# Patient Record
Sex: Male | Born: 1995 | Hispanic: Yes | Marital: Married | Smoking: Never smoker
Health system: Southern US, Community
[De-identification: ages and names within clinical notes are randomized; demographics above are authoritative.]

---

## 2014-01-06 ENCOUNTER — Ambulatory Visit
Admission: RE | Admit: 2014-01-06 | Discharge: 2014-01-06 | Disposition: A | Discharge status: Home / Self Care | Payer: No Typology Code available for payment source | Source: Ambulatory Visit | Attending: Occupational Medicine | Admitting: Occupational Medicine

## 2014-01-06 ENCOUNTER — Other Ambulatory Visit: Payer: Self-pay | Admitting: Occupational Medicine

## 2014-01-06 DIAGNOSIS — Z021 Encounter for pre-employment examination: Secondary | ICD-10-CM

## 2016-05-08 IMAGING — CR DG CHEST 1V
1 series · 1 of 1 positions shown · non-contrast
Comparison: None.

CLINICAL DATA: Pre-employment examination

EXAM:
CHEST - 1 VIEW

[view not recorded]
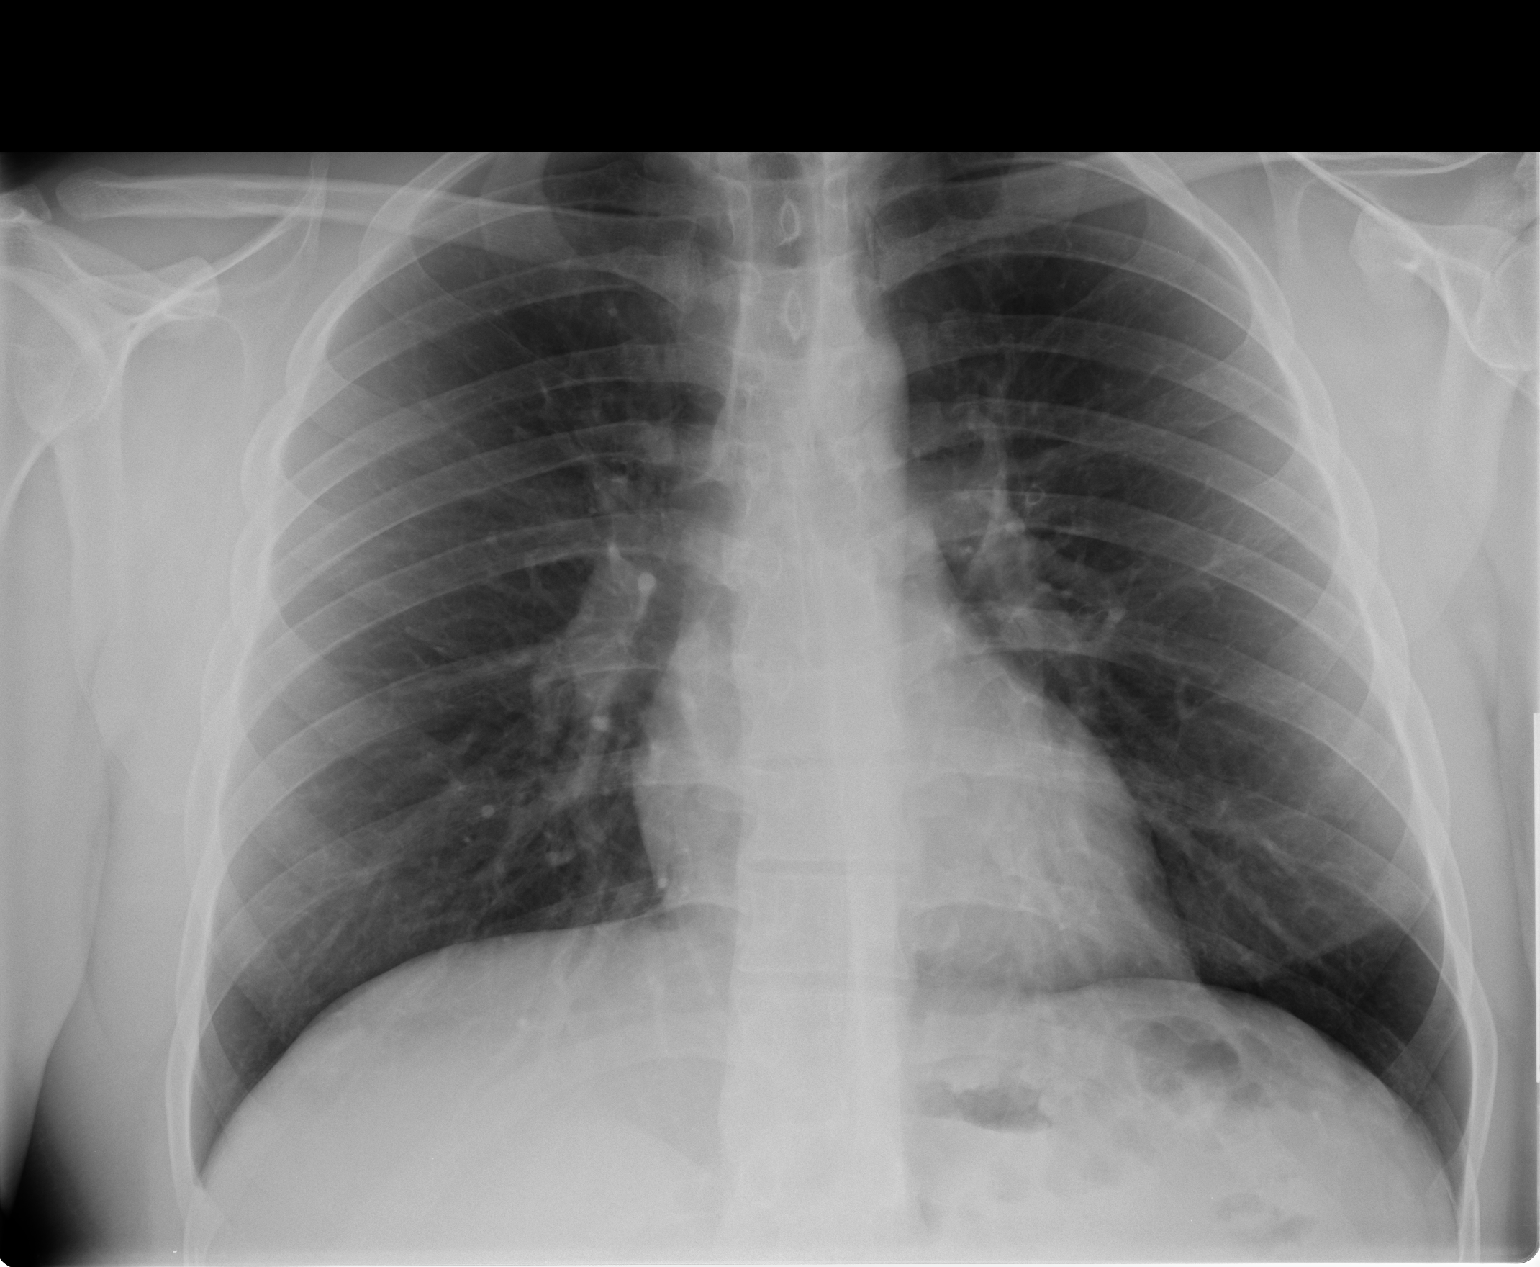

[1 of 1 positions shown; findings below may reference images not displayed]

FINDINGS: Cardiomediastinal silhouette is unremarkable. No acute infiltrate or
pleural effusion. No pulmonary edema. Bony thorax is unremarkable.
IMPRESSION: No active disease.

## 2016-12-23 DIAGNOSIS — H1089 Other conjunctivitis: Secondary | ICD-10-CM | POA: Diagnosis not present

## 2016-12-23 DIAGNOSIS — K1379 Other lesions of oral mucosa: Secondary | ICD-10-CM | POA: Diagnosis not present

## 2016-12-27 DIAGNOSIS — R1310 Dysphagia, unspecified: Secondary | ICD-10-CM | POA: Diagnosis not present

## 2016-12-29 DIAGNOSIS — J029 Acute pharyngitis, unspecified: Secondary | ICD-10-CM | POA: Diagnosis not present

## 2016-12-29 DIAGNOSIS — K219 Gastro-esophageal reflux disease without esophagitis: Secondary | ICD-10-CM | POA: Diagnosis not present

## 2016-12-29 DIAGNOSIS — J039 Acute tonsillitis, unspecified: Secondary | ICD-10-CM | POA: Diagnosis not present

## 2017-02-13 DIAGNOSIS — R030 Elevated blood-pressure reading, without diagnosis of hypertension: Secondary | ICD-10-CM | POA: Diagnosis not present

## 2017-02-24 DIAGNOSIS — Z7689 Persons encountering health services in other specified circumstances: Secondary | ICD-10-CM | POA: Diagnosis not present

## 2017-02-26 DIAGNOSIS — Z0001 Encounter for general adult medical examination with abnormal findings: Secondary | ICD-10-CM | POA: Diagnosis not present

## 2017-02-26 DIAGNOSIS — R030 Elevated blood-pressure reading, without diagnosis of hypertension: Secondary | ICD-10-CM | POA: Diagnosis not present

## 2017-02-26 DIAGNOSIS — K625 Hemorrhage of anus and rectum: Secondary | ICD-10-CM | POA: Diagnosis not present

## 2017-03-01 ENCOUNTER — Ambulatory Visit (INDEPENDENT_AMBULATORY_CARE_PROVIDER_SITE_OTHER): Payer: 59 | Admitting: Otolaryngology

## 2017-03-01 DIAGNOSIS — J351 Hypertrophy of tonsils: Secondary | ICD-10-CM

## 2017-11-25 ENCOUNTER — Other Ambulatory Visit: Payer: Self-pay

## 2017-11-25 ENCOUNTER — Encounter (HOSPITAL_COMMUNITY): Payer: Self-pay

## 2017-11-25 ENCOUNTER — Emergency Department (HOSPITAL_COMMUNITY)
Admission: EM | Admit: 2017-11-25 | Discharge: 2017-11-25 | Disposition: A | Discharge status: Home / Self Care | Payer: No Typology Code available for payment source | Attending: Emergency Medicine | Admitting: Emergency Medicine

## 2017-11-25 DIAGNOSIS — Z8659 Personal history of other mental and behavioral disorders: Secondary | ICD-10-CM | POA: Diagnosis not present
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
## 2017-11-25 NOTE — Discharge Instructions (Signed)
Return to ED for worsening symptoms, chest pain, shortness of breath, severe headache, vomiting or coughing up blood, lightheadedness or loss of consciousness. °
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
## 2017-11-25 NOTE — ED Notes (Signed)
Bed: WA02 Expected date:  Expected time:  Means of arrival:  Comments: 

## 2017-12-13 DIAGNOSIS — S40022A Contusion of left upper arm, initial encounter: Secondary | ICD-10-CM | POA: Diagnosis not present

## 2017-12-13 DIAGNOSIS — M79602 Pain in left arm: Secondary | ICD-10-CM | POA: Diagnosis not present

## 2018-01-01 DIAGNOSIS — S40022D Contusion of left upper arm, subsequent encounter: Secondary | ICD-10-CM | POA: Diagnosis not present

## 2018-01-01 DIAGNOSIS — M25522 Pain in left elbow: Secondary | ICD-10-CM | POA: Diagnosis not present

## 2018-01-15 DIAGNOSIS — S40022D Contusion of left upper arm, subsequent encounter: Secondary | ICD-10-CM | POA: Diagnosis not present

## 2018-01-15 DIAGNOSIS — M25522 Pain in left elbow: Secondary | ICD-10-CM | POA: Diagnosis not present

## 2018-02-06 DIAGNOSIS — M542 Cervicalgia: Secondary | ICD-10-CM | POA: Diagnosis not present

## 2018-02-08 DIAGNOSIS — M542 Cervicalgia: Secondary | ICD-10-CM | POA: Diagnosis not present

## 2018-02-15 DIAGNOSIS — M542 Cervicalgia: Secondary | ICD-10-CM | POA: Diagnosis not present

## 2018-02-20 DIAGNOSIS — M25522 Pain in left elbow: Secondary | ICD-10-CM | POA: Diagnosis not present

## 2018-02-20 DIAGNOSIS — M542 Cervicalgia: Secondary | ICD-10-CM | POA: Diagnosis not present

## 2018-02-20 DIAGNOSIS — S40022D Contusion of left upper arm, subsequent encounter: Secondary | ICD-10-CM | POA: Diagnosis not present

## 2018-02-26 DIAGNOSIS — Z6834 Body mass index (BMI) 34.0-34.9, adult: Secondary | ICD-10-CM | POA: Diagnosis not present

## 2018-02-26 DIAGNOSIS — R030 Elevated blood-pressure reading, without diagnosis of hypertension: Secondary | ICD-10-CM | POA: Diagnosis not present

## 2018-03-08 DIAGNOSIS — Z0000 Encounter for general adult medical examination without abnormal findings: Secondary | ICD-10-CM | POA: Diagnosis not present

## 2018-03-08 DIAGNOSIS — R7301 Impaired fasting glucose: Secondary | ICD-10-CM | POA: Diagnosis not present

## 2018-03-08 DIAGNOSIS — R030 Elevated blood-pressure reading, without diagnosis of hypertension: Secondary | ICD-10-CM | POA: Diagnosis not present

## 2018-04-05 DIAGNOSIS — M542 Cervicalgia: Secondary | ICD-10-CM | POA: Diagnosis not present

## 2018-04-05 DIAGNOSIS — R030 Elevated blood-pressure reading, without diagnosis of hypertension: Secondary | ICD-10-CM | POA: Diagnosis not present

## 2018-10-04 ENCOUNTER — Other Ambulatory Visit: Payer: Self-pay

## 2018-10-04 DIAGNOSIS — Z20822 Contact with and (suspected) exposure to covid-19: Secondary | ICD-10-CM

## 2018-10-06 LAB — NOVEL CORONAVIRUS, NAA: SARS-CoV-2, NAA: NOT DETECTED

## 2020-04-26 ENCOUNTER — Other Ambulatory Visit: Payer: Self-pay

## 2020-04-26 ENCOUNTER — Ambulatory Visit (INDEPENDENT_AMBULATORY_CARE_PROVIDER_SITE_OTHER): Payer: 59 | Admitting: Pulmonary Disease

## 2020-04-26 ENCOUNTER — Encounter: Payer: Self-pay | Admitting: Pulmonary Disease

## 2020-04-26 VITALS — BP 124/80 | HR 104 | Temp 98.0°F | Ht 69.0 in | Wt 241.0 lb

## 2020-04-26 DIAGNOSIS — G4733 Obstructive sleep apnea (adult) (pediatric): Secondary | ICD-10-CM | POA: Diagnosis not present

## 2020-04-26 DIAGNOSIS — R0683 Snoring: Secondary | ICD-10-CM

## 2020-04-26 NOTE — Patient Instructions (Signed)
Home sleep test 
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
## 2020-04-26 NOTE — Assessment & Plan Note (Signed)
Given excessive daytime somnolence, narrow pharyngeal exam, witnessed apneas & loud snoring, obstructive sleep apnea is very likely & an overnight polysomnogram will be scheduled as a home study. The pathophysiology of obstructive sleep apnea , it's cardiovascular consequences & modes of treatment including CPAP were discused with the patient in detail & they evidenced understanding.  Pretest probability is high.  If he only has mild disease then we could consider weight loss and tonsillectomy alone.  However if he has more than AHI 15/hour, then we would have to offer him treatment in the form of oral appliance or CPAP.  Nasal interface may suffice.  He is willing to use if needed.

## 2020-05-11 ENCOUNTER — Other Ambulatory Visit: Payer: Self-pay

## 2020-05-11 ENCOUNTER — Ambulatory Visit: Payer: 59

## 2020-05-11 DIAGNOSIS — R0683 Snoring: Secondary | ICD-10-CM

## 2020-05-11 DIAGNOSIS — G4733 Obstructive sleep apnea (adult) (pediatric): Secondary | ICD-10-CM | POA: Diagnosis not present

## 2020-05-13 ENCOUNTER — Telehealth: Payer: Self-pay | Admitting: Pulmonary Disease

## 2020-05-13 DIAGNOSIS — G4733 Obstructive sleep apnea (adult) (pediatric): Secondary | ICD-10-CM

## 2020-05-13 NOTE — Telephone Encounter (Signed)
HST showed severe  OSA with AHI 29/ hr Suggest autoCPAP  5-15 cm, mask of choice OV with me/APP in 6 wks after starting

## 2020-05-18 ENCOUNTER — Telehealth: Payer: Self-pay | Admitting: Pulmonary Disease

## 2020-05-18 DIAGNOSIS — G4733 Obstructive sleep apnea (adult) (pediatric): Secondary | ICD-10-CM

## 2020-05-18 NOTE — Telephone Encounter (Signed)
Attempted to call patient with no answer and was unable to leave message on voicemail due to mailbox currently being full. Will try to call again at another time.

## 2020-05-18 NOTE — Telephone Encounter (Signed)
HST showed severe  OSA with AHI 29/ hr Suggest autoCPAP  5-15 cm, mask of choice OV with me/APP in 6 wks after starting   Called and spoke with pt and he is aware of results per RA.  Order placed for pt to be set up for cpap.  Pt is aware that there may be a wait on the cpap due to being on back order.  Pt is aware to call the office once he does receive the cpap so we can set him up with APPT with RA 6 wks after starting the cpap.

## 2021-08-12 ENCOUNTER — Telehealth: Payer: Self-pay | Admitting: Pulmonary Disease

## 2021-08-12 NOTE — Telephone Encounter (Signed)
ATC patient for clarification that insurance actually covers a new cpap every year? No answer.   Dr. Vassie Loll, please advise is it okay to send an order for a new cpap for patient? He was last seen in 04/26/2020

## 2021-08-15 NOTE — Telephone Encounter (Signed)
Next available for Dr. Vassie Loll in [place] and [facility] was 09/2021.  Called patient and he asked for a sooner appt. Scheduled him to see NP Ames Dura this Friday at 4:30 pm to reassess cpap machine. Dr. Vassie Loll pleae advise, is this ok?

## 2021-08-19 ENCOUNTER — Encounter: Payer: Self-pay | Admitting: Primary Care

## 2021-08-19 ENCOUNTER — Ambulatory Visit (INDEPENDENT_AMBULATORY_CARE_PROVIDER_SITE_OTHER): Payer: 59 | Admitting: Primary Care

## 2021-08-19 VITALS — BP 139/74 | HR 102 | Temp 98.3°F | Ht 69.0 in | Wt 255.0 lb

## 2021-08-19 DIAGNOSIS — G4733 Obstructive sleep apnea (adult) (pediatric): Secondary | ICD-10-CM
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
## 2021-08-19 NOTE — Patient Instructions (Signed)
Pleasure meeting you today Andrew Mcdaniel  Recommendations: Continue to wear CPAP every night min 4-6 hours or longer Work on weight loss if able Do not drive if experiencing excessive daytime sleepiness   Orders: Renew CPAP with [facility]   Follow-up: 1 year with Dr. Vassie Loll or sooner if needed   Sleep Apnea Sleep apnea affects breathing during sleep. It causes breathing to stop for 10 seconds or more, or to become shallow. People with sleep apnea usually snore loudly. It can also increase the risk of: Heart attack. Stroke. Being very overweight (obese). Diabetes. Heart failure. Irregular heartbeat. High blood pressure. The goal of treatment is to help you breathe normally again. What are the causes?  The most common cause of this condition is a collapsed or blocked airway. There are three kinds of sleep apnea: Obstructive sleep apnea. This is caused by a blocked or collapsed airway. Central sleep apnea. This happens when the brain does not send the right signals to the muscles that control breathing. Mixed sleep apnea. This is a combination of obstructive and central sleep apnea. What increases the risk? Being overweight. Smoking. Having a small airway. Being older. Being male. Drinking alcohol. Taking medicines to calm yourself (sedatives or tranquilizers). Having family members with the condition. Having a tongue or tonsils that are larger than normal. What are the signs or symptoms? Trouble staying asleep. Loud snoring. Headaches in the morning. Waking up gasping. Dry mouth or sore throat in the morning. Being sleepy or tired during the day. If you are sleepy or tired during the day, you may also: Not be able to focus your mind (concentrate). Forget things. Get angry a lot and have mood swings. Feel sad (depressed). Have changes in your personality. Have less interest in sex, if you are male. Be unable to have an erection, if you are male. How is  this treated?  Sleeping on your side. Using a medicine to get rid of mucus in your nose (decongestant). Avoiding the use of alcohol, medicines to help you relax, or certain pain medicines (narcotics). Losing weight, if needed. Changing your diet. Quitting smoking. Using a machine to open your airway while you sleep, such as: An oral appliance. This is a mouthpiece that shifts your lower jaw forward. A CPAP device. This device blows air through a mask when you breathe out (exhale). An EPAP device. This has valves that you put in each nostril. A BIPAP device. This device blows air through a mask when you breathe in (inhale) and breathe out. Having surgery if other treatments do not work. Follow these instructions at home: Lifestyle Make changes that your doctor recommends. Eat a healthy diet. Lose weight if needed. Avoid alcohol, medicines to help you relax, and some pain medicines. Do not smoke or use any products that contain nicotine or tobacco. If you need help quitting, ask your doctor. General instructions Take over-the-counter and prescription medicines only as told by your doctor. If you were given a machine to use while you sleep, use it only as told by your doctor. If you are having surgery, make sure to tell your doctor you have sleep apnea. You may need to bring your device with you. Keep all follow-up visits. Contact a doctor if: The machine that you were given to use during sleep bothers you or does not seem to be working. You do not get better. You get worse. Get help right away if: Your chest hurts. You have trouble breathing in enough air. You  have an uncomfortable feeling in your back, arms, or stomach. You have trouble talking. One side of your body feels weak. A part of your face is hanging down. These symptoms may be an emergency. Get help right away. Call your local emergency services (911 in the [place] Do not wait to see if the symptoms will go away. Do not  drive yourself to the hospital. Summary This condition affects breathing during sleep. The most common cause is a collapsed or blocked airway. The goal of treatment is to help you breathe normally while you sleep. This information is not intended to replace advice given to you by your health care provider. Make sure you discuss any questions you have with your health care provider. Document Revised: 08/25/2020 Document Reviewed: 12/26/2019 Elsevier Patient Education  2023 Elsevier Inc.   CPAP and BIPAP Information CPAP and BIPAP are methods that use air pressure to keep your airways open and to help you breathe well. CPAP and BIPAP use different amounts of pressure. Your health care provider will tell you whether CPAP or BIPAP would be more helpful for you. CPAP stands for "continuous positive airway pressure." With CPAP, the amount of pressure stays the same while you breathe in (inhale) and out (exhale). BIPAP stands for "bi-level positive airway pressure." With BIPAP, the amount of pressure will be higher when you inhale and lower when you exhale. This allows you to take larger breaths. CPAP or BIPAP may be used in the hospital, or your health care provider may want you to use it at home. You may need to have a sleep study before your health care provider can order a machine for you to use at home. What are the advantages? CPAP or BIPAP can be helpful if you have: Sleep apnea. Chronic obstructive pulmonary disease (COPD). Heart failure. Medical conditions that cause muscle weakness, including muscular dystrophy or amyotrophic lateral sclerosis (ALS). Other problems that cause breathing to be shallow, weak, abnormal, or difficult. CPAP and BIPAP are most commonly used for obstructive sleep apnea (OSA) to keep the airways from collapsing when the muscles relax during sleep. What are the risks? Generally, this is a safe treatment. However, problems may occur, including: Irritated skin or  skin sores if the mask does not fit properly. Dry or stuffy nose or nosebleeds. Dry mouth. Feeling gassy or bloated. Sinus or lung infection if the equipment is not cleaned properly. When should CPAP or BIPAP be used? In most cases, the mask only needs to be worn during sleep. Generally, the mask needs to be worn throughout the night and during any daytime naps. People with certain medical conditions may also need to wear the mask at other times, such as when they are awake. Follow instructions from your health care provider about when to use the machine. What happens during CPAP or BIPAP?  Both CPAP and BIPAP are provided by a small machine with a flexible plastic tube that attaches to a plastic mask that you wear. Air is blown through the mask into your nose or mouth. The amount of pressure that is used to blow the air can be adjusted on the machine. Your health care provider will set the pressure setting and help you find the best mask for you. Tips for using the mask Because the mask needs to be snug, some people feel trapped or closed-in (claustrophobic) when first using the mask. If you feel this way, you may need to get used to the mask. One way to do  this is to hold the mask loosely over your nose or mouth and then gradually apply the mask more snugly. You can also gradually increase the amount of time that you use the mask. Masks are available in various types and sizes. If your mask does not fit well, talk with your health care provider about getting a different one. Some common types of masks include: Full face masks, which fit over the mouth and nose. Nasal masks, which fit over the nose. Nasal pillow or prong masks, which fit into the nostrils. If you are using a mask that fits over your nose and you tend to breathe through your mouth, a chin strap may be applied to help keep your mouth closed. Use a skin barrier to protect your skin as told by your health care provider. Some CPAP and  BIPAP machines have alarms that may sound if the mask comes off or develops a leak. If you have trouble with the mask, it is very important that you talk with your health care provider about finding a way to make the mask easier to tolerate. Do not stop using the mask. There could be a negative impact on your health if you stop using the mask. Tips for using the machine Place your CPAP or BIPAP machine on a secure table or stand near an electrical outlet. Know where the on/off switch is on the machine. Follow instructions from your health care provider about how to set the pressure on your machine and when you should use it. Do not eat or drink while the CPAP or BIPAP machine is on. Food or fluids could get pushed into your lungs by the pressure of the CPAP or BIPAP. For home use, CPAP and BIPAP machines can be rented or purchased through home health care companies. Many different brands of machines are available. Renting a machine before purchasing may help you find out which particular machine works well for you. Your health insurance company may also decide which machine you may get. Keep the CPAP or BIPAP machine and attachments clean. Ask your health care provider for specific instructions. Check the humidifier if you have a dry stuffy nose or nosebleeds. Make sure it is working correctly. Follow these instructions at home: Take over-the-counter and prescription medicines only as told by your health care provider. Ask if you can take sinus medicine if your sinuses are blocked. Do not use any products that contain nicotine or tobacco. These products include cigarettes, chewing tobacco, and vaping devices, such as e-cigarettes. If you need help quitting, ask your health care provider. Keep all follow-up visits. This is important. Contact a health care provider if: You have redness or pressure sores on your head, face, mouth, or nose from the mask or head gear. You have trouble using the CPAP or  BIPAP machine. You cannot tolerate wearing the CPAP or BIPAP mask. Someone tells you that you snore even when wearing your CPAP or BIPAP. Get help right away if: You have trouble breathing. You feel confused. Summary CPAP and BIPAP are methods that use air pressure to keep your airways open and to help you breathe well. If you have trouble with the mask, it is very important that you talk with your health care provider about finding a way to make the mask easier to tolerate. Do not stop using the mask. There could be a negative impact to your health if you stop using the mask. Follow instructions from your health care provider about when to  use the machine. This information is not intended to replace advice given to you by your health care provider. Make sure you discuss any questions you have with your health care provider. Document Revised: 08/25/2020 Document Reviewed: 12/26/2019 Elsevier Patient Education  2023 [employer].

## 2021-08-19 NOTE — Assessment & Plan Note (Signed)
-   HST 05/11/21 >>moderate-severe OSA, AHI 29/hr. Patient is compliant with CPAP and reports benefit from CPAP use. His sleep/work schedule varies. Pressure 5-15cm h20 (12.1cm h20-95%); AHI 1.7/hr. Renewing CPAP supplies with [facility]. Advised patient aim to wear CPAP every night for min 4-6 hours. Encourage weight loss efforts. Advised against driving if experiencing excessive daytime sleepiness. FU in 1 year or sooner if needed.

## 2022-12-25 ENCOUNTER — Telehealth: Payer: Self-pay | Admitting: Primary Care

## 2022-12-25 NOTE — Telephone Encounter (Signed)
Needs a new machine and CPAP supplies

## 2022-12-27 NOTE — Telephone Encounter (Signed)
Patient not seen in >1year. Please schedule follow up. Cannot order new machine/supplies without visit.

## 2023-01-30 ENCOUNTER — Encounter: Payer: Self-pay | Admitting: Primary Care

## 2023-01-30 ENCOUNTER — Ambulatory Visit: Payer: 59 | Admitting: Primary Care

## 2023-01-30 VITALS — BP 123/87 | HR 91 | Temp 97.3°F | Ht 69.0 in | Wt 282.0 lb

## 2023-01-30 DIAGNOSIS — G4733 Obstructive sleep apnea (adult) (pediatric): Secondary | ICD-10-CM | POA: Diagnosis not present

## 2023-01-30 NOTE — Patient Instructions (Addendum)
 -  OBSTRUCTIVE SLEEP APNEA: Obstructive sleep apnea is a condition where your breathing repeatedly stops and starts during sleep due to blocked airways. Your condition is currently well controlled with the auto CPAP machine, which you have been using consistently. We will continue with your current settings (minimum pressure of 5 and maximum pressure of 15). You should renew your CPAP supplies through [facility]. To address the drooling issue, you can add a chin strap to help keep your mouth closed during sleep. If the drooling continues, consider adjusting the humidification settings on your machine. Additionally, we will provide a prescription for a travel CPAP machine, which you can purchase out-of-pocket.  Orders: Renew CPAP supplies/add chin strap    Follow-up 1 year with Beth NP or Dr. Jude

## 2023-01-30 NOTE — Progress Notes (Signed)
 @Patient  ID: Andrew Mcdaniel, male    DOB: 07-23-1995, 27 y.o.   MRN: [id number]  No chief complaint on file.   Referring provider: Suanne Pfeiffer, NP  HPI: 27 year old male, never smoked.  Past medical history significant for OSA.  Patient of Dr. Jude, seen for initial consult on 04/26/20.   Previous [facility] pulmonary encounter:  08/19/2021 Patient presents today for overdue follow-up for OSA.  He had a home sleep study on 05/11/20 that showed severe OSA, AHI 29/hr. It was suggested that he be started on auto CPAP 5 to 15 cm H2O with mask of choice. He has been doing well since starting CPAP. He feels CPAP has been life changing for him. He is sleeping better and energy/mood have improved. He is compliant with use. 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 No issues with pressure setting or mask fit. He uses full face mask. Needs order to renew supplies with [facility].   Airview download 05/21/21-08/18/21 Usage 62/90 days used (69%) Average usage 4 hgours 19 mins Pressure 5-15cm h20 (12.1cm h20- 95%) Airleaks 10.8L/min (95%) AHI 1.7    01/30/2023- Interim hx  Discussed the use of AI scribe software for clinical note transcription with the patient, who gave verbal consent to proceed.  History of Present Illness   The patient, previously diagnosed with moderate to severe sleep apnea in 2022 and has been maintained on auto CPAP. He reports a significant improvement in sleep quality and daytime alertness since starting the therapy. The machine is reportedly functioning well, but the patient had to replace a broken face mask recently. He has been compliant with the use of the CPAP machine, with data showing 87% usage out of the last 30 days, averaging about seven and a half hours per night. CPAP is on auto setting with pressure ranges from a minimum of 5-15cm h20 and the residual apnea score is 1.6/hour, indicating well-controlled sleep apnea with the  current settings.  The patient has been experiencing issues with drooling in the mask, which he finds uncomfortable. We discussed using a chin strap to prevent his mouth from opening during sleep as a potential solution. He also expresses a need for a travel CPAP machine due to occasional travel requirements related to his [place]. The patient acknowledges the need to replace the air filter on his CPAP machine and is aware of the recommended replacement schedule for various CPAP supplies. He obtained his CPAP machine from [employer].      Airview download 12/30/22-01/28/23 26/30 days (87%); 25 days (83%) Average usage 7 hours 27 mins Pressure 5-15cm h20 Airleaks 5.5L/min (95%) AHI 1.6  No Known Allergies  Immunization History  Administered Date(s) Administered   Influenza-Unspecified 10/30/2017, 12/24/2019   PFIZER(Purple Top)SARS-COV-2 Vaccination 01/01/2020, 02/06/2020    No past medical history on file.  Tobacco History: Social History   Tobacco Use  Smoking Status Never  Smokeless Tobacco Never   Counseling given: Not Answered   Outpatient Medications Prior to Visit  Medication Sig Dispense Refill   buPROPion (WELLBUTRIN XL) 150 MG 24 hr tablet Take by mouth.     No facility-administered medications prior to visit.   Review of Systems  Review of Systems  Constitutional: Negative.   HENT: Negative.    Respiratory: Negative.    Cardiovascular: Negative.   Psychiatric/Behavioral: Negative.     Physical Exam  There were no vitals taken for this visit. Physical Exam Constitutional:  Appearance: Normal appearance.  HENT:     Head: Normocephalic and atraumatic.     Mouth/Throat:     Mouth: Mucous membranes are moist.     Pharynx: Oropharynx is clear.  Cardiovascular:     Rate and Rhythm: Normal rate and regular rhythm.  Pulmonary:     Effort: Pulmonary effort is normal.     Breath sounds: Normal breath sounds.  Musculoskeletal:         General: Normal range of motion.  Skin:    General: Skin is warm.  Neurological:     General: No focal deficit present.     Mental Status: He is alert and oriented to person, place, and time. Mental status is at baseline.  Psychiatric:        Mood and Affect: Mood normal.        Behavior: Behavior normal.        Thought Content: Thought content normal.        Judgment: Judgment normal.      Lab Results:  CBC No results found for: WBC, RBC, HGB, HCT, PLT, MCV, MCH, MCHC, RDW, LYMPHSABS, MONOABS, EOSABS, BASOSABS  BMET No results found for: NA, K, CL, CO2, GLUCOSE, BUN, CREATININE, CALCIUM, GFRNONAA, GFRAA  BNP No results found for: BNP  ProBNP No results found for: PROBNP  Imaging: No results found.   Assessment & Plan:   1. OSA (obstructive sleep apnea) (Primary) - Ambulatory Referral for DME - Ambulatory Referral for DME     Obstructive Sleep Apnea Moderate to severe sleep apnea diagnosed in April 2022 with AHI of 29. Currently well controlled on auto CPAP with residual AHI of 1.6/hr. Patient is 87% compliant with CPAP use over the last 30 days, averaging 7.5 hours per night over the past 30 days. Reports benefit from wearing.  -Continue current auto CPAP settings (min pressure 5, max pressure 15). -Renew CPAP supplies through [facility]. -Add chin strap  -Consider adjusting humidification settings if drooling persists. -Provide prescription for travel CPAP, to be purchased out-of-pocket.  Follow-up in one year unless issues arise.      Andrew LELON Ferrari, NP 01/30/2023

## 2023-02-26 ENCOUNTER — Ambulatory Visit: Payer: 59 | Admitting: Primary Care

## 2023-11-12 ENCOUNTER — Ambulatory Visit (INDEPENDENT_AMBULATORY_CARE_PROVIDER_SITE_OTHER): Admitting: Plastic Surgery

## 2023-11-12 ENCOUNTER — Encounter: Payer: Self-pay | Admitting: Plastic Surgery

## 2023-11-12 VITALS — BP 133/84 | HR 90 | Ht 70.0 in | Wt 276.0 lb

## 2023-11-12 DIAGNOSIS — L918 Other hypertrophic disorders of the skin: Secondary | ICD-10-CM | POA: Diagnosis not present

## 2023-11-12 NOTE — Progress Notes (Signed)
 Referring Provider Suanne Pfeiffer, NP [address] [place],  [place] [address]   CC:  Chief Complaint  Patient presents with   Consult           Andrew Mcdaniel is an 28 y.o. male.  HPI: Mr. Seybold is a 28 year old male who is referred for evaluation of several small skin tags on his left upper eyelid.  These have been there for several years.  They are uncomfortable but have not changed in any significant way.  He would like to have them removed  No Known Allergies  Outpatient Encounter Medications as of 11/12/2023  Medication Sig   buPROPion (WELLBUTRIN XL) 150 MG 24 hr tablet Take by mouth.   fexofenadine (ALLEGRA) 180 MG tablet Take 180 mg by mouth daily.   omeprazole (PRILOSEC) 20 MG capsule Take 20 mg by mouth daily.   No facility-administered encounter medications on file as of 11/12/2023.     No past medical history on file.  No past surgical history on file.  No family history on file.  Social History   Social History Narrative   Not on file     Review of Systems General: Denies fevers, chills, weight loss CV: Denies chest pain, shortness of breath, palpitations Skin: Skin tags left upper eyelid  Physical Exam    11/12/2023   10:56 AM 01/30/2023   11:39 AM 08/19/2021    4:28 PM  Vitals with BMI  Height 5' 10 5' 9 5' 9  Weight 276 lbs 282 lbs 255 lbs  BMI 39.6 41.63 37.64  Systolic 133 123 860  Diastolic 84 87 74  Pulse 90 91 102    General:  No acute distress,  Alert and oriented, Non-Toxic, Normal speech and affect Skin: As noted patient has several small skin tags on the left upper eyelid.  Interestingly he does not have any on the right side.   Assessment/Plan Acrochordons: Patient has several benign appearing skin tags on the left upper eyelid.  They cause him some discomfort and he would like to have them removed.  I have offered to do this today in the office.  He understands that this is generally done without local  anesthetic.  He would like to proceed.  Leonce KATHEE [place] 11/12/2023, 11:27 AM   Procedure Note  Preoperative Dx: Acrochordons  Postoperative Dx: Same  Procedure: Excision of benign skin tags  Anesthesia: Lidocaine 1% with 1:100,000 epinephrine and 0.25% Sensorcaine   Indication for Procedure: Irritation due to benign skin tags  Description of Procedure: Risks and complications were explained to the patient including the possibility of recurrence.  Consent was confirmed and the patient understands the risks and benefits.  The potential complications and alternatives were explained and the patient consents.  The patient expressed understanding the option of not having the procedure and the risks of a scar.  Time out was called and all information was confirmed to be correct.    The area was prepped and drapped.  Each of the skin tags was grasped with forceps and excised with scissors.  After obtaining hemostasis, the surgical wound was closed with a Band-Aid.  The surgical wounds measured less than 1 mm, 5 skin tags were removed on the upper and lower eyelid.  A dressing was applied.  The patient was given instructions on how to care for the area and he will follow-up as needed.SABRA Alm tolerated the procedure well and there were no complications.
